# Patient Record
Sex: Female | Born: 1960 | Race: Asian | Marital: Married | Smoking: Never smoker
Health system: Southern US, Community
[De-identification: ages and names within clinical notes are randomized; demographics above are authoritative.]

---

## 2015-10-14 ENCOUNTER — Encounter: Payer: Self-pay | Admitting: Emergency Medicine

## 2015-10-14 ENCOUNTER — Observation Stay
Admission: EM | Admit: 2015-10-14 | Discharge: 2015-10-15 | Disposition: A | Discharge status: Home / Self Care | Payer: BLUE CROSS/BLUE SHIELD | Attending: Internal Medicine | Admitting: Internal Medicine

## 2015-10-14 ENCOUNTER — Emergency Department: Payer: BLUE CROSS/BLUE SHIELD

## 2015-10-14 DIAGNOSIS — R0600 Dyspnea, unspecified: Secondary | ICD-10-CM | POA: Insufficient documentation

## 2015-10-14 DIAGNOSIS — Z7982 Long term (current) use of aspirin: Secondary | ICD-10-CM | POA: Diagnosis not present

## 2015-10-14 DIAGNOSIS — I639 Cerebral infarction, unspecified: Secondary | ICD-10-CM

## 2015-10-14 DIAGNOSIS — G459 Transient cerebral ischemic attack, unspecified: Secondary | ICD-10-CM

## 2015-10-14 DIAGNOSIS — I10 Essential (primary) hypertension: Secondary | ICD-10-CM | POA: Diagnosis not present

## 2015-10-14 DIAGNOSIS — R200 Anesthesia of skin: Principal | ICD-10-CM | POA: Insufficient documentation

## 2015-10-14 DIAGNOSIS — Z8249 Family history of ischemic heart disease and other diseases of the circulatory system: Secondary | ICD-10-CM | POA: Diagnosis not present

## 2015-10-14 DIAGNOSIS — H538 Other visual disturbances: Secondary | ICD-10-CM | POA: Insufficient documentation

## 2015-10-14 LAB — BASIC METABOLIC PANEL
ANION GAP: 5 (ref 5–15)
BUN: 23 mg/dL — ABNORMAL HIGH (ref 6–20)
CHLORIDE: 108 mmol/L (ref 101–111)
CO2: 27 mmol/L (ref 22–32)
Calcium: 9.9 mg/dL (ref 8.9–10.3)
Creatinine, Ser: 0.92 mg/dL (ref 0.44–1.00)
Glucose, Bld: 89 mg/dL (ref 65–99)
POTASSIUM: 4 mmol/L (ref 3.5–5.1)
SODIUM: 140 mmol/L (ref 135–145)

## 2015-10-14 LAB — URINALYSIS COMPLETE WITH MICROSCOPIC (ARMC ONLY)
BACTERIA UA: NONE SEEN
Bilirubin Urine: NEGATIVE
GLUCOSE, UA: NEGATIVE mg/dL
HGB URINE DIPSTICK: NEGATIVE
Ketones, ur: NEGATIVE mg/dL
LEUKOCYTES UA: NEGATIVE
NITRITE: NEGATIVE
PH: 6 (ref 5.0–8.0)
Protein, ur: NEGATIVE mg/dL
SPECIFIC GRAVITY, URINE: 1.015 (ref 1.005–1.030)

## 2015-10-14 LAB — TROPONIN I: Troponin I: <0.03 ng/mL (ref <0.03)

## 2015-10-14 LAB — CBC
HEMATOCRIT: 43.3 % (ref 35.0–47.0)
HEMOGLOBIN: 14.1 g/dL (ref 12.0–16.0)
MCH: 25.8 pg — ABNORMAL LOW (ref 26.0–34.0)
MCHC: 32.6 g/dL (ref 32.0–36.0)
MCV: 79 fL — ABNORMAL LOW (ref 80.0–100.0)
Platelets: 210 10*3/uL (ref 150–440)
RBC: 5.48 MIL/uL — AB (ref 3.80–5.20)
RDW: 14.5 % (ref 11.5–14.5)
WBC: 5.4 10*3/uL (ref 3.6–11.0)

## 2015-10-14 LAB — TSH: TSH: 2.023 u[IU]/mL (ref 0.350–4.500)

## 2015-10-14 LAB — GLUCOSE, CAPILLARY: GLUCOSE-CAPILLARY: 81 mg/dL (ref 65–99)

## 2015-10-14 MED ORDER — ONDANSETRON HCL 4 MG PO TABS: 4.0000 mg | ORAL_TABLET | Freq: Four times a day (QID) | ORAL | Status: DC | PRN

## 2015-10-14 MED ORDER — ONDANSETRON HCL 4 MG/2ML IJ SOLN: 4.0000 mg | Freq: Four times a day (QID) | INTRAMUSCULAR | Status: DC | PRN

## 2015-10-14 MED ORDER — ASPIRIN 325 MG PO TABS
325.0000 mg | ORAL_TABLET | Freq: Once | ORAL | Status: AC
  Administered 2015-10-14: 325 mg via ORAL
  Filled 2015-10-14: qty 1

## 2015-10-14 MED ORDER — ENOXAPARIN SODIUM 40 MG/0.4ML ~~LOC~~ SOLN
40.0000 mg | SUBCUTANEOUS | Status: DC
  Filled 2015-10-14: qty 0.4

## 2015-10-14 MED ORDER — ACETAMINOPHEN 325 MG PO TABS: 650.0000 mg | ORAL_TABLET | Freq: Four times a day (QID) | ORAL | Status: DC | PRN

## 2015-10-14 MED ORDER — LISINOPRIL 10 MG PO TABS
10.0000 mg | ORAL_TABLET | Freq: Every day | ORAL | Status: DC
  Administered 2015-10-14 – 2015-10-15 (×2): 10 mg via ORAL
  Filled 2015-10-14 (×2): qty 1

## 2015-10-14 MED ORDER — ALPRAZOLAM 0.25 MG PO TABS
0.2500 mg | ORAL_TABLET | Freq: Four times a day (QID) | ORAL | Status: DC | PRN
  Filled 2015-10-14: qty 1

## 2015-10-14 MED ORDER — ACETAMINOPHEN 650 MG RE SUPP: 650.0000 mg | Freq: Four times a day (QID) | RECTAL | Status: DC | PRN

## 2015-10-14 MED ORDER — SODIUM CHLORIDE 0.9% FLUSH
3.0000 mL | Freq: Two times a day (BID) | INTRAVENOUS | Status: DC
Start: 2015-10-14 — End: 2015-10-15
  Administered 2015-10-14 – 2015-10-15 (×2): 3 mL via INTRAVENOUS

## 2015-10-14 MED ORDER — ATORVASTATIN CALCIUM 20 MG PO TABS
40.0000 mg | ORAL_TABLET | Freq: Every day | ORAL | Status: DC
  Administered 2015-10-14: 19:00:00 40 mg via ORAL
  Filled 2015-10-14: qty 2

## 2015-10-14 MED ORDER — HYDRALAZINE HCL 20 MG/ML IJ SOLN
10.0000 mg | INTRAMUSCULAR | Status: DC | PRN
  Administered 2015-10-14: 19:00:00 10 mg via INTRAVENOUS
  Filled 2015-10-14: qty 1

## 2015-10-14 MED ORDER — ASPIRIN EC 325 MG PO TBEC
325.0000 mg | DELAYED_RELEASE_TABLET | Freq: Every day | ORAL | Status: DC
Start: 2015-10-14 — End: 2015-10-15
  Administered 2015-10-15: 11:00:00 325 mg via ORAL
  Filled 2015-10-14: qty 1

## 2015-10-14 MED ORDER — ASPIRIN 81 MG PO CHEW
CHEWABLE_TABLET | ORAL | Status: AC
  Filled 2015-10-14: qty 4

## 2015-10-14 NOTE — ED Notes (Signed)
Pt in nad.  Sitting on bed in hallway.

## 2015-10-14 NOTE — ED Notes (Signed)
Pt presents to ED c/o numbness to extremities with dizziness that is intermittent since last week. Pt states these events are short; often checks BP but has not been diagnosed with high blood pressure. A/o x4, in NAD

## 2015-10-14 NOTE — H&P (Signed)
[facility] [place] [facility]   PATIENT NAME: Regina Hoffman    MR#:  [id number]  DATE OF BIRTH:  September 28, 1960  DATE OF ADMISSION:  10/14/2015  PRIMARY CARE PHYSICIAN: No PCP Per Patient   REQUESTING/REFERRING PHYSICIAN:   CHIEF COMPLAINT:   Chief Complaint  Patient presents with  . Numbness  . Dizziness  . Hypertension    HISTORY OF PRESENT ILLNESS: Regina Hoffman  is a 55 y.o. female with not significant past medical history, who does not have primary care physician and had not been seen one for a while, who presents to the hospitals complains of one-week history of left-sided numbness and weakness, coming on and off 3 or 4 times a day. Patient denies any falls, however, admits of significant weakness with episodes of numbness of the left side of her face and body. On arrival to emergency room, her EKG revealed a sinus rhythm with premature atrial complexes, CT of head was unremarkable. She denies any numbness or weakness at present.  PAST MEDICAL HISTORY:  History reviewed. No pertinent past medical history.  PAST SURGICAL HISTORY: History reviewed. No pertinent past surgical history.  SOCIAL HISTORY:  Social History  Substance Use Topics  . Smoking status: Never Smoker   . Smokeless tobacco: Not on file  . Alcohol Use: No    FAMILY HISTORY: Significant for hypertension, patient's mother had MI at age of 54, father had MI at age of 977.  DRUG ALLERGIES: No Known Allergies  Review of Systems  Constitutional: Positive for chills. Negative for fever, weight loss and malaise/fatigue.  HENT: Positive for congestion.   Eyes: Positive for blurred vision. Negative for double vision.  Respiratory: Positive for shortness of breath. Negative for cough, sputum production and wheezing.   Cardiovascular: Negative for chest pain, palpitations, orthopnea, leg swelling and PND.  Gastrointestinal: Negative for nausea, vomiting, abdominal pain, diarrhea, constipation, blood in  stool and melena.  Genitourinary: Negative for dysuria, urgency, frequency and hematuria.  Musculoskeletal: Negative for falls.  Skin: Negative for rash.  Neurological: Positive for sensory change, focal weakness and weakness. Negative for dizziness.  Psychiatric/Behavioral: Negative for depression and memory loss. The patient is not nervous/anxious.     MEDICATIONS AT HOME:  Prior to Admission medications   Medication Sig Start Date End Date Taking? Authorizing Provider  aspirin EC 81 MG tablet Take 81 mg by mouth daily.   Yes Historical Provider, MD      PHYSICAL EXAMINATION:   VITAL SIGNS: Blood pressure 178/72, pulse 54, temperature 98 F (36.7 C), temperature source Oral, resp. rate 14, height 5\' 4"  (1.626 m), weight 70.308 kg (155 lb), SpO2 98 %.  GENERAL:  55 y.o.-year-old patient lying in the bed with no acute distress.  EYES: Pupils equal, round, reactive to light and accommodation. No scleral icterus. Extraocular muscles intact.  HEENT: Head atraumatic, normocephalic. Oropharynx and nasopharynx clear.  NECK:  Supple, no jugular venous distention. No thyroid enlargement, no tenderness.  LUNGS: Normal breath sounds bilaterally, no wheezing, rales,rhonchi or crepitation. No use of accessory muscles of respiration.  CARDIOVASCULAR: S1, S2 normal. No murmurs, rubs, or gallops.  ABDOMEN: Soft, nontender, nondistended. Bowel sounds present. No organomegaly or mass.  EXTREMITIES: No pedal edema, cyanosis, or clubbing.  NEUROLOGIC: Cranial nerves II through XII are intact. Muscle strength 5/5 in all extremities. Sensation intact. Gait not checked.  PSYCHIATRIC: The patient is alert and oriented x 3.  SKIN: No obvious rash, lesion, or ulcer. Bruising of the  chin was noted  LABORATORY PANEL:   CBC  Recent Labs Lab 10/14/15 1130  WBC 5.4  HGB 14.1  HCT 43.3  PLT 210  MCV 79.0*  MCH 25.8*  MCHC 32.6  RDW 14.5    ------------------------------------------------------------------------------------------------------------------  Chemistries   Recent Labs Lab 10/14/15 1130  NA 140  K 4.0  CL 108  CO2 27  GLUCOSE 89  BUN 23*  CREATININE 0.92  CALCIUM 9.9   ------------------------------------------------------------------------------------------------------------------  Cardiac Enzymes No results for input(s): TROPONINI in the last 168 hours. ------------------------------------------------------------------------------------------------------------------  RADIOLOGY: Ct Head Wo Contrast  10/14/2015  CLINICAL DATA:  Numbness to extremities with dizziness that is intermittent since last week. EXAM: CT HEAD WITHOUT CONTRAST TECHNIQUE: Contiguous axial images were obtained from the base of the skull through the vertex without intravenous contrast. COMPARISON:  None. FINDINGS: Brain: Ventricles are normal in size and configuration. All areas of the brain demonstrate normal gray-white matter attenuation. There is no mass, hemorrhage, edema or other evidence of acute parenchymal abnormality. No extra-axial hemorrhage. Vascular: No hyperdense vessel or unexpected calcification. Skull: Negative for fracture or focal lesion. Sinuses/Orbits: No acute findings. Visualized upper paranasal sinuses are clear. Mastoid air cells are clear. Other: None. IMPRESSION: Normal head CT. Electronically Signed   By: Bary Richard M.D.   On: 10/14/2015 12:34    EKG: Orders placed or performed during the hospital encounter of 10/14/15  . EKG 12-Lead  . EKG 12-Lead  . ED EKG  . ED EKG   EKG in the emergency room revealed sinus rhythm with premature atrial complexes, rate of 60 bpm, normal axis, no acute distress. T changes noted   IMPRESSION AND PLAN:  Principal Problem:   TIA (transient ischemic attack) Active Problems:   Malignant essential hypertension #1 frequent TIAs, likely related to malignant essential  hypertension, admitted to a medical floor, initiate her on aspirin full dose, get carotid ultrasound as well as echocardiogram, neurology consultation #2. Malignant essential hypertension, initiate patient on lisinopril, advance it as needed #3. Dyspnea, check O2 sats on exertion, could be related to markedly elevated blood pressure #4, blurry vision, very likely hypertensive encephalopathy, follow with therapy   All the records are reviewed and case discussed with ED provider. Management plans discussed with the patient, family and they are in agreement.  CODE STATUS: Code Status History    This patient does not have a recorded code status. Please follow your organizational policy for patients in this situation.       TOTAL TIME TAKING CARE OF THIS PATIENT: 50 minutes.    Katharina Caper M.D on 10/14/2015 at 4:47 PM  Between 7am to 6pm - Pager - [phone] After 6pm go to [email or web address] - password [email or web address] [facility]  [facility]  Office  [phone]  CC: Primary care physician; No PCP Per Patient

## 2015-10-14 NOTE — ED Notes (Signed)
Pt sitting on the side of the bed  - she reports  "I have been feeling dizzy for about one week now - I went to church today and then here - I do not know why I have been dizzy."  Assessment completed  She denies pain   CBG 81

## 2015-10-14 NOTE — ED Provider Notes (Signed)
[facility] Emergency Department Provider Note  ____________________________________________  Time seen: Approximately 11:56 AM  I have reviewed the triage vital signs and the nursing notes.   HISTORY  Chief Complaint Numbness; Dizziness; and Hypertension   HPI Regina Hoffman is a 55 y.o. female no significant past medical history who presents for evaluation of left-sided numbness. Patient reports that she has noticed her blood pressure is elevated up to 200 systolic at home for the last 10 years. She hasn't seen a doctor and she does not have health insurance for more than 20 years. She reports that since last week she's been having episodes of left-sided numbness. Her last episode was just prior to arrival to the emergency department. These episodes resolved completely with no intervention. She reports numbness in the left side of her face, her left upper and lower extremities that lasted minutes up to hours sometimes. She denies headache, weakness, facial droop, slurred speech, gait instability. She reports a history of stroke in both her father and her mother. She does not smoke. She denies history of diabetes. She denies chest pain, shortness of breath, headache, changes in vision, nausea, vomiting, abdominal pain, back pain. Patient is accompanied by her daughter. She denies numbness at this time however does endorse dizziness.  History reviewed. No pertinent past medical history.  Patient Active Problem List   Diagnosis Date Noted  . Malignant essential hypertension 10/14/2015  . TIA (transient ischemic attack) 10/14/2015    History reviewed. No pertinent past surgical history.  No current outpatient prescriptions on file.  Allergies Review of patient's allergies indicates no known allergies.  History reviewed. No pertinent family history.  Social History Social History  Substance Use Topics  . Smoking status: Never Smoker   . Smokeless tobacco: None    . Alcohol Use: No    Review of Systems  Constitutional: Negative for fever. Eyes: Negative for visual changes. ENT: Negative for sore throat. Cardiovascular: Negative for chest pain. Respiratory: Negative for shortness of breath. Gastrointestinal: Negative for abdominal pain, vomiting or diarrhea. Genitourinary: Negative for dysuria. Musculoskeletal: Negative for back pain. Skin: Negative for rash. Neurological: Negative for headaches. +Left-sided numbness  ____________________________________________   PHYSICAL EXAM:  VITAL SIGNS: ED Triage Vitals  Enc Vitals Group     BP 10/14/15 1113 166/83 mmHg     Pulse Rate 10/14/15 1113 18     Resp 10/14/15 1113 18     Temp 10/14/15 1113 98 F (36.7 C)     Temp Source 10/14/15 1113 Oral     SpO2 10/14/15 1113 100 %     Weight 10/14/15 1113 155 lb (70.308 kg)     Height 10/14/15 1113  (1.626 m)     Head Cir --      Peak Flow --      Pain Score --      Pain Loc --      Pain Edu? --      Excl. in GC? --     Constitutional: Alert and oriented. Well appearing and in no apparent distress. HEENT:      Head: Normocephalic and atraumatic.         Eyes: Conjunctivae are normal. Sclera is non-icteric. EOMI. PERRL      Mouth/Throat: Mucous membranes are moist.       Neck: Supple with no signs of meningismus. Cardiovascular: Regular rate and rhythm. No murmurs, gallops, or rubs. 2+ symmetrical distal pulses are present in all extremities. No JVD.  Respiratory: Normal respiratory effort. Lungs are clear to auscultation bilaterally. No wheezes, crackles, or rhonchi.  Gastrointestinal: Soft, non tender, and non distended with positive bowel sounds. No rebound or guarding. Genitourinary: No suprapubic tenderness. No CVA tenderness. Musculoskeletal: Nontender with normal range of motion in all extremities. No edema, cyanosis, or erythema of extremities. Neurologic: A & O x3, PERRL, no nystagmus, CN II-XII intact, motor testing reveals  good tone and bulk throughout. There is no evidence of pronator drift or dysmetria. Muscle strength is 5/5 throughout. Deep tendon reflexes are 2+ throughout with downgoing toes. Sensory examination is intact. Gait is normal. Skin: Skin is warm, dry and intact. No rash noted. Psychiatric: Mood and affect are normal. Speech and behavior are normal.  ____________________________________________   LABS (all labs ordered are listed, but only abnormal results are displayed)  Labs Reviewed  BASIC METABOLIC PANEL - Abnormal; Notable for the following:    BUN 23 (*)    All other components within normal limits  CBC - Abnormal; Notable for the following:    RBC 5.48 (*)    MCV 79.0 (*)    MCH 25.8 (*)    All other components within normal limits  URINALYSIS COMPLETEWITH MICROSCOPIC ([facility] ONLY) - Abnormal; Notable for the following:    Color, Urine YELLOW (*)    APPearance CLEAR (*)    Squamous Epithelial / LPF 0-5 (*)    All other components within normal limits  LIPID PANEL - Abnormal; Notable for the following:    Cholesterol 204 (*)    LDL Cholesterol 132 (*)    All other components within normal limits  BASIC METABOLIC PANEL - Abnormal; Notable for the following:    Glucose, Bld 104 (*)    All other components within normal limits  CBC - Abnormal; Notable for the following:    MCV 78.5 (*)    All other components within normal limits  GLUCOSE, CAPILLARY  TROPONIN I  TSH  TROPONIN I  TROPONIN I  HEMOGLOBIN A1C  CBG MONITORING, ED   ____________________________________________  EKG  ED ECG REPORT I, Nita Sicklearolina Rillie Riffel, the attending physician, personally viewed and interpreted this ECG.  Sinus rhythm with occasional PVCs, rate of 60, normal intervals, normal axis, no ST elevations or depressions. ____________________________________________  RADIOLOGY  Head CT: negative ____________________________________________   PROCEDURES  Procedure(s) performed:  None Critical Care performed:  None ____________________________________________   INITIAL IMPRESSION / ASSESSMENT AND PLAN / ED COURSE  55 y.o. female no significant past medical history who presents for evaluation of multiple episodes of left-sided numbness over the course of the last week with last episode this morning. Patient has strong family history of stroke. Patient has undiagnosed hypertension and hasn't seen a doctor in over 20 years. She is neurologically intact at this time. Plan for labs and a head CT. We'll discuss with the hospitalist for admission for TIA workup.  ----------------------------------------- 3:18 PM on 10/14/2015 -----------------------------------------  CT negative. Patient remains neurologically intact. Full dose aspirin given. Patient be admitted to the hospitalist for TIA workup.   Pertinent labs & imaging results that were available during my care of the patient were reviewed by me and considered in my medical decision making (see chart for details).    ____________________________________________   FINAL CLINICAL IMPRESSION(S) / ED DIAGNOSES  Final diagnoses:  Transient cerebral ischemia, unspecified transient cerebral ischemia type      NEW MEDICATIONS STARTED DURING THIS VISIT:  Current Discharge Medication List  Note:  This document was prepared using Dragon voice recognition software and may include unintentional dictation errors.    Nita Sickle, MD 10/15/15 [phone]

## 2015-10-15 ENCOUNTER — Observation Stay: Payer: BLUE CROSS/BLUE SHIELD

## 2015-10-15 ENCOUNTER — Observation Stay (HOSPITAL_BASED_OUTPATIENT_CLINIC_OR_DEPARTMENT_OTHER)
Admit: 2015-10-15 | Discharge: 2015-10-15 | Disposition: A | Discharge status: Home / Self Care | Payer: BLUE CROSS/BLUE SHIELD | Attending: Internal Medicine | Admitting: Internal Medicine

## 2015-10-15 DIAGNOSIS — R200 Anesthesia of skin: Secondary | ICD-10-CM | POA: Diagnosis not present

## 2015-10-15 DIAGNOSIS — G459 Transient cerebral ischemic attack, unspecified: Secondary | ICD-10-CM | POA: Diagnosis not present

## 2015-10-15 LAB — BASIC METABOLIC PANEL
Anion gap: 7 (ref 5–15)
BUN: 18 mg/dL (ref 6–20)
CALCIUM: 9.8 mg/dL (ref 8.9–10.3)
CO2: 26 mmol/L (ref 22–32)
CREATININE: 0.9 mg/dL (ref 0.44–1.00)
Chloride: 106 mmol/L (ref 101–111)
Glucose, Bld: 104 mg/dL — ABNORMAL HIGH (ref 65–99)
Potassium: 3.6 mmol/L (ref 3.5–5.1)
SODIUM: 139 mmol/L (ref 135–145)

## 2015-10-15 LAB — LIPID PANEL
Cholesterol: 204 mg/dL — ABNORMAL HIGH (ref 0–200)
HDL: 46 mg/dL (ref >40)
LDL CALC: 132 mg/dL — AB (ref 0–99)
TRIGLYCERIDES: 130 mg/dL (ref <150)
Total CHOL/HDL Ratio: 4.4 RATIO
VLDL: 26 mg/dL (ref 0–40)

## 2015-10-15 LAB — CBC
HCT: 40.1 % (ref 35.0–47.0)
Hemoglobin: 13.3 g/dL (ref 12.0–16.0)
MCH: 26 pg (ref 26.0–34.0)
MCHC: 33.1 g/dL (ref 32.0–36.0)
MCV: 78.5 fL — ABNORMAL LOW (ref 80.0–100.0)
Platelets: 195 10*3/uL (ref 150–440)
RBC: 5.11 MIL/uL (ref 3.80–5.20)
RDW: 14 % (ref 11.5–14.5)
WBC: 7.1 10*3/uL (ref 3.6–11.0)

## 2015-10-15 LAB — TROPONIN I

## 2015-10-15 LAB — ECHOCARDIOGRAM COMPLETE
HEIGHTINCHES: 64 in
Weight: 2480 oz

## 2015-10-15 LAB — HEMOGLOBIN A1C: Hgb A1c MFr Bld: 5.4 % (ref 4.0–6.0)

## 2015-10-15 MED ORDER — LISINOPRIL 10 MG PO TABS: 10.0000 mg | ORAL_TABLET | Freq: Every day | ORAL | Status: AC

## 2015-10-15 NOTE — Progress Notes (Signed)
*  PRELIMINARY RESULTS* Echocardiogram 2D Echocardiogram has been performed.  Cristela BlueHege, [name] 10/15/2015, 10:34 AM

## 2015-10-15 NOTE — Progress Notes (Signed)
SATURATION QUALIFICATIONS: (This note is used to comply with regulatory documentation for home oxygen)  Patient Saturations on Room Air at Rest = 100%  Patient Saturations on Room Air while Ambulating = 98% 

## 2015-10-15 NOTE — Discharge Summary (Signed)
[facility] - [place] at [facility]   PATIENT NAME: Regina Hoffman Cordova    MR#:  [id number]  DATE OF BIRTH:  1960/08/20  DATE OF ADMISSION:  10/14/2015 ADMITTING PHYSICIAN: Katharina Caperima Vaickute, MD  DATE OF DISCHARGE: 10/15/2015  PRIMARY CARE PHYSICIAN: No PCP Per Patient    ADMISSION DIAGNOSIS:  CVA (cerebral infarction) [I63.9] Transient cerebral ischemia, unspecified transient cerebral ischemia type [G45.9]  DISCHARGE DIAGNOSIS:  Numbness left side Essential hypertension  SECONDARY DIAGNOSIS:  History reviewed. No pertinent past medical history.  HOSPITAL COURSE:   1. Numbness left side of the body. This lasts minutes at a time. MRI of the brain negative. EEG negative. Continue aspirin at home. I doubt this is a TIA. This is not a stroke. Follow-up with neurology as outpatient and consider an EMG. 2. Essential hypertension. Lisinopril started in the hospital. Blood pressure trending better. Low-salt diet discussed. Compliance with blood pressure medications and follow-up with medical doctor needed.  DISCHARGE CONDITIONS:   Satisfactory  CONSULTS OBTAINED:  Treatment Team:  Thana FarrLeslie Reynolds, MD  DRUG ALLERGIES:  No Known Allergies  DISCHARGE MEDICATIONS:   Current Discharge Medication List    START taking these medications   Details  lisinopril (PRINIVIL,ZESTRIL) 10 MG tablet Take 1 tablet (10 mg total) by mouth daily. Qty: 30 tablet, Refills: 0      CONTINUE these medications which have NOT CHANGED   Details  aspirin EC 81 MG tablet Take 81 mg by mouth daily.         DISCHARGE INSTRUCTIONS:   Patient given names of primary care physicians in the area Follow-up with neurology 2 weeks  If you experience worsening of your admission symptoms, develop shortness of breath, life threatening emergency, suicidal or homicidal thoughts you must seek medical attention immediately by calling 911 or calling your MD immediately  if symptoms less severe.  You Must read  complete instructions/literature along with all the possible adverse reactions/side effects for all the Medicines you take and that have been prescribed to you. Take any new Medicines after you have completely understood and accept all the possible adverse reactions/side effects.   Please note  You were cared for by a hospitalist during your hospital stay. If you have any questions about your discharge medications or the care you received while you were in the hospital after you are discharged, you can call the unit and asked to speak with the hospitalist on call if the hospitalist that took care of you is not available. Once you are discharged, your primary care physician will handle any further medical issues. Please note that NO REFILLS for any discharge medications will be authorized once you are discharged, as it is imperative that you return to your primary care physician (or establish a relationship with a primary care physician if you do not have one) for your aftercare needs so that they can reassess your need for medications and monitor your lab values.    Today   CHIEF COMPLAINT:   Chief Complaint  Patient presents with  . Numbness  . Dizziness  . Hypertension    HISTORY OF PRESENT ILLNESS:  Regina Hoffman Yusko  is a 55 y.o. female presents with numbness on the left side   VITAL SIGNS:  Blood pressure 144/61, pulse 63, temperature 98.2 F (36.8 C), temperature source Oral, resp. rate 18, height 5\' 4"  (1.626 m), weight 70.308 kg (155 lb), SpO2 100 %.    PHYSICAL EXAMINATION:  GENERAL:  55 y.o.-year-old patient lying in the  bed with no acute distress.  EYES: Pupils equal, round, reactive to light and accommodation. No scleral icterus. Extraocular muscles intact.  HEENT: Head atraumatic, normocephalic. Oropharynx and nasopharynx clear.  NECK:  Supple, no jugular venous distention. No thyroid enlargement, no tenderness.  LUNGS: Normal breath sounds bilaterally, no wheezing, rales,rhonchi  or crepitation. No use of accessory muscles of respiration.  CARDIOVASCULAR: S1, S2 normal. No murmurs, rubs, or gallops.  ABDOMEN: Soft, non-tender, non-distended. Bowel sounds present. No organomegaly or mass.  EXTREMITIES: No pedal edema, cyanosis, or clubbing.  NEUROLOGIC: Cranial nerves II through XII are intact. Muscle strength 5/5 in all extremities. Sensation intact. Gait not checked.  PSYCHIATRIC: The patient is alert and oriented x 3.  SKIN: No obvious rash, lesion, or ulcer.   DATA REVIEW:   CBC  Recent Labs Lab 10/15/15  WBC 7.1  HGB 13.3  HCT 40.1  PLT 195    Chemistries   Recent Labs Lab 10/15/15  NA 139  K 3.6  CL 106  CO2 26  GLUCOSE 104*  BUN 18  CREATININE 0.90  CALCIUM 9.8    Cardiac Enzymes  Recent Labs Lab 10/15/15 0542  TROPONINI <0.03      RADIOLOGY:  Ct Head Wo Contrast  10/14/2015  CLINICAL DATA:  Numbness to extremities with dizziness that is intermittent since last week. EXAM: CT HEAD WITHOUT CONTRAST TECHNIQUE: Contiguous axial images were obtained from the base of the skull through the vertex without intravenous contrast. COMPARISON:  None. FINDINGS: Brain: Ventricles are normal in size and configuration. All areas of the brain demonstrate normal gray-white matter attenuation. There is no mass, hemorrhage, edema or other evidence of acute parenchymal abnormality. No extra-axial hemorrhage. Vascular: No hyperdense vessel or unexpected calcification. Skull: Negative for fracture or focal lesion. Sinuses/Orbits: No acute findings. Visualized upper paranasal sinuses are clear. Mastoid air cells are clear. Other: None. IMPRESSION: Normal head CT. Electronically Signed   By: Bary Khrystal Jeanmarie M.D.   On: 10/14/2015 12:34   Mr Brain Wo Contrast  10/15/2015  CLINICAL DATA:  55 -year-old female with hypertension presenting with 1 week of left-sided numbness and weakness 3 to 4 times a day. EXAM: MRI HEAD WITHOUT CONTRAST TECHNIQUE: Multiplanar,  multiecho pulse sequences of the brain and surrounding structures were obtained without intravenous contrast. COMPARISON:  CT of the head dated 10/14/2015. FINDINGS: No evidence of acute infarct, intracranial hemorrhage, or focal mass effect. Few nonspecific foci of T2 FLAIR hyperintensity in subcortical white matter in a predominant bifrontal distribution probably represent minimal chronic microvascular ischemic changes. Normal ventricle size. No hydrocephalus. No extra-axial collection is identified. Small bilateral maxillary sinus mucous retention cysts. Mild mucosal thickening of ethmoid sinuses. Underpneumatized frontal sinuses. Visualized orbits are unremarkable. No abnormal signal of mastoid air cells. The calvarium is unremarkable. IMPRESSION: 1. No evidence of acute infarct, intracranial hemorrhage, or focal mass effect. 2. Minimal chronic microvascular ischemic changes. 3. Mild paranasal sinus disease. Electronically Signed   By: Mitzi Hansen M.D.   On: 10/15/2015 12:13   US Carotid Bilateral  10/15/2015  CLINICAL DATA:  Acute stroke symptoms, hypertension, syncope EXAM: BILATERAL CAROTID DUPLEX ULTRASOUND TECHNIQUE: Wallace Cullens scale imaging, color Doppler and duplex ultrasound were performed of bilateral carotid and vertebral arteries in the neck. COMPARISON:  None. FINDINGS: Criteria: Quantification of carotid stenosis is based on velocity parameters that correlate the residual internal carotid diameter with NASCET-based stenosis levels, using the diameter of the distal internal carotid lumen as the denominator for stenosis measurement. The following velocity  measurements were obtained: RIGHT ICA:  84/31 cm/sec CCA:  90/25 cm/sec SYSTOLIC ICA/CCA RATIO:  0.9 DIASTOLIC ICA/CCA RATIO:  1.2 ECA:  110 cm/sec LEFT ICA:  75/36 cm/sec CCA:  89/30 cm/sec SYSTOLIC ICA/CCA RATIO:  0.8 DIASTOLIC ICA/CCA RATIO:  1.2 ECA:  77 cm/sec RIGHT CAROTID ARTERY: Minor echogenic shadowing plaque formation. No  hemodynamically significant right ICA stenosis, velocity elevation, or turbulent flow. Degree of narrowing less than 50%. RIGHT VERTEBRAL ARTERY:  Antegrade LEFT CAROTID ARTERY: Similar scattered minor echogenic plaque formation. No hemodynamically significant left ICA stenosis, velocity elevation, or turbulent flow. LEFT VERTEBRAL ARTERY:  Antegrade IMPRESSION: Minor carotid atherosclerosis. No hemodynamically significant ICA stenosis. Degree of narrowing less than 50% bilaterally. Patent antegrade vertebral flow bilaterally. Electronically Signed   By: Judie Petit.  Shick M.D.   On: 10/15/2015 11:17    Management plans discussed with the patient, family and they are in agreement.  CODE STATUS:     Code Status Orders        Start     Ordered   10/14/15 1819  Full code   Continuous     10/14/15 1818    Code Status History    Date Active Date Inactive Code Status Order ID Comments User Context   This patient has a current code status but no historical code status.      TOTAL TIME TAKING CARE OF THIS PATIENT: 35 minutes.    Alford Highland M.D on 10/15/2015 at 3:20 PM  Between 7am to 6pm - Pager - [phone]  After 6pm go to [email or web address] - password [email or web address]  [facility] Office  [phone]  CC: Primary care physician; No PCP Per Patient

## 2015-10-15 NOTE — Care Management (Signed)
Admitted to [facility] with the diagnosis of TIA under observation status. Lives with husband. Daughter is Jola BaptistGiang Trieu [phone]). Takes care of all basic and instrumental activities of daily living herself, drives. No primary care physician. Gave list of physicians accepting new patients to daughter.  Uses no aids for ambulation.   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 Gwenette GreetBrenda S Rola Lennon RN MSN CCM Care Management [phone]

## 2015-10-15 NOTE — Progress Notes (Signed)
Discussed discharge instructions and medications with pt and her daughter at bedside.  IV removed. No questions at this time.  Pt transported home via car by her daughters.  Orson Apeanielle Khyree Carillo, RN

## 2015-10-15 NOTE — Consult Note (Addendum)
Referring Physician: Renae GlossWieting    Chief Complaint: Left sided numbness  HPI: Regina Hoffman is an 55 y.o. female who presents to the hospital complaining of a one-week history of intermittent left-sided numbness.  It may occur multiple times a day and has been daily.  It resolves on its own and only lasts a few minutes.  She describes a line going down the middle of her body and being numb to the left of it.  She has no other associated symptoms and reports no loss of consciousness or confusion.   Date last known well: One week ago Time last known well: Unable to determine tPA Given: No: Resolution of symptoms  History reviewed. No pertinent past medical history.  History reviewed. No pertinent past surgical history.  Family history: Significant for hypertension, patient's mother had MI at age of 54, father had MI at age of 777.  Social History:  reports that she has never smoked. She does not have any smokeless tobacco history on file. She reports that she does not drink alcohol or use illicit drugs.  Allergies: No Known Allergies  Medications:  I have reviewed the patient's current medications. Prior to Admission:  Prescriptions prior to admission  Medication Sig Dispense Refill Last Dose  . aspirin EC 81 MG tablet Take 81 mg by mouth daily.   10/13/2015 at Unknown time   Scheduled: . aspirin EC  325 mg Oral Daily  . atorvastatin  40 mg Oral q1800  . enoxaparin (LOVENOX) injection  40 mg Subcutaneous Q24H  . lisinopril  10 mg Oral Daily  . sodium chloride flush  3 mL Intravenous Q12H    ROS: History obtained from the patient  General ROS: negative for - chills, fatigue, fever, night sweats, weight gain or weight loss Psychological ROS: negative for - behavioral disorder, hallucinations, memory difficulties, mood swings or suicidal ideation Ophthalmic ROS: negative for - blurry vision, double vision, eye pain or loss of vision ENT ROS: negative for - epistaxis, nasal discharge, oral  lesions, sore throat, tinnitus or vertigo Allergy and Immunology ROS: negative for - hives or itchy/watery eyes Hematological and Lymphatic ROS: negative for - bleeding problems, bruising or swollen lymph nodes Endocrine ROS: negative for - galactorrhea, hair pattern changes, polydipsia/polyuria or temperature intolerance Respiratory ROS: negative for - cough, hemoptysis, shortness of breath or wheezing Cardiovascular ROS: negative for - chest pain, dyspnea on exertion, edema or irregular heartbeat Gastrointestinal ROS: negative for - abdominal pain, diarrhea, hematemesis, nausea/vomiting or stool incontinence Genito-Urinary ROS: negative for - dysuria, hematuria, incontinence or urinary frequency/urgency Musculoskeletal ROS: negative for - joint swelling or muscular weakness Neurological ROS: as noted in HPI Dermatological ROS: negative for rash and skin lesion changes  Physical Examination: Blood pressure 130/61, pulse 58, temperature 97.8 F (36.6 C), temperature source Oral, resp. rate 20, height 5\' 4"  (1.626 m), weight 70.308 kg (155 lb), SpO2 99 %.  HEENT-  Normocephalic, no lesions, without obvious abnormality.  Normal external eye and conjunctiva.  Normal TM's bilaterally.  Normal auditory canals and external ears. Normal external nose, mucus membranes and septum.  Normal pharynx. Cardiovascular- S1, S2 normal, pulses palpable throughout   Lungs- chest clear, no wheezing, rales, normal symmetric air entry Abdomen- soft, non-tender; bowel sounds normal; no masses,  no organomegaly Extremities- no edema Lymph-no adenopathy palpable Musculoskeletal-no joint tenderness, deformity or swelling Skin-warm and dry, no hyperpigmentation, vitiligo, or suspicious lesions  Neurological Examination Mental Status: Alert, oriented, thought content appropriate.  Speech fluent without evidence of  aphasia.  Able to follow 3 step commands without difficulty. Cranial Nerves: II: Discs flat  bilaterally; Visual fields grossly normal, pupils equal, round, reactive to light and accommodation III,IV, VI: ptosis not present, extra-ocular motions intact bilaterally V,VII: smile symmetric, facial light touch sensation normal bilaterally VIII: hearing normal bilaterally IX,X: gag reflex present XI: bilateral shoulder shrug XII: midline tongue extension Motor: Right : Upper extremity   5/5    Left:     Upper extremity   5/5  Lower extremity   5/5     Lower extremity   5/5 Tone and bulk:normal tone throughout; no atrophy noted Sensory: Pinprick and light touch intact throughout, bilaterally Deep Tendon Reflexes: 2+ and symmetric throughout Plantars: Right: downgoing   Left: downgoing Cerebellar: normal finger-to-nose, normal rapid alternating movements and normal heel-to-shin test Gait: normal gait and station      Laboratory Studies:  Basic Metabolic Panel:  Recent Labs Lab 10/14/15 1130 10/15/15  NA 140 139  K 4.0 3.6  CL 108 106  CO2 27 26  GLUCOSE 89 104*  BUN 23* 18  CREATININE 0.92 0.90  CALCIUM 9.9 9.8    Liver Function Tests: No results for input(s): AST, ALT, ALKPHOS, BILITOT, PROT, ALBUMIN in the last 168 hours. No results for input(s): LIPASE, AMYLASE in the last 168 hours. No results for input(s): AMMONIA in the last 168 hours.  CBC:  Recent Labs Lab 10/14/15 1130 10/15/15  WBC 5.4 7.1  HGB 14.1 13.3  HCT 43.3 40.1  MCV 79.0* 78.5*  PLT 210 195    Cardiac Enzymes:  Recent Labs Lab 10/14/15 1833 10/14/15 2358 10/15/15 0542  TROPONINI <0.03 <0.03 <0.03    BNP: Invalid input(s): POCBNP  CBG:  Recent Labs Lab 10/14/15 1214  GLUCAP 81    Microbiology: No results found for this or any previous visit.  Coagulation Studies: No results for input(s): LABPROT, INR in the last 72 hours.  Urinalysis:  Recent Labs Lab 10/14/15 1130  COLORURINE YELLOW*  LABSPEC 1.015  PHURINE 6.0  GLUCOSEU NEGATIVE  HGBUR NEGATIVE   BILIRUBINUR NEGATIVE  KETONESUR NEGATIVE  PROTEINUR NEGATIVE  NITRITE NEGATIVE  LEUKOCYTESUR NEGATIVE    Lipid Panel:    Component Value Date/Time   CHOL 204* 10/15/2015 0000   TRIG 130 10/15/2015 0000   HDL 46 10/15/2015 0000   CHOLHDL 4.4 10/15/2015 0000   VLDL 26 10/15/2015 0000   LDLCALC 132* 10/15/2015 0000    HgbA1C:  Lab Results  Component Value Date   HGBA1C 5.4 10/14/2015    Urine Drug Screen:  No results found for: LABOPIA, COCAINSCRNUR, LABBENZ, AMPHETMU, THCU, LABBARB  Alcohol Level: No results for input(s): ETH in the last 168 hours.  Other results: EKG: sinus rhythm at 60 bpm with premature atrial complexes.  Imaging: Ct Head Wo Contrast  10/14/2015  CLINICAL DATA:  Numbness to extremities with dizziness that is intermittent since last week. EXAM: CT HEAD WITHOUT CONTRAST TECHNIQUE: Contiguous axial images were obtained from the base of the skull through the vertex without intravenous contrast. COMPARISON:  None. FINDINGS: Brain: Ventricles are normal in size and configuration. All areas of the brain demonstrate normal gray-white matter attenuation. There is no mass, hemorrhage, edema or other evidence of acute parenchymal abnormality. No extra-axial hemorrhage. Vascular: No hyperdense vessel or unexpected calcification. Skull: Negative for fracture or focal lesion. Sinuses/Orbits: No acute findings. Visualized upper paranasal sinuses are clear. Mastoid air cells are clear. Other: None. IMPRESSION: Normal head CT. Electronically Signed   By:  Bary Richard M.D.   On: 10/14/2015 12:34   Mr Brain Wo Contrast  10/15/2015  CLINICAL DATA:  70 -year-old female with hypertension presenting with 1 week of left-sided numbness and weakness 3 to 4 times a day. EXAM: MRI HEAD WITHOUT CONTRAST TECHNIQUE: Multiplanar, multiecho pulse sequences of the brain and surrounding structures were obtained without intravenous contrast. COMPARISON:  CT of the head dated 10/14/2015. FINDINGS:  No evidence of acute infarct, intracranial hemorrhage, or focal mass effect. Few nonspecific foci of T2 FLAIR hyperintensity in subcortical white matter in a predominant bifrontal distribution probably represent minimal chronic microvascular ischemic changes. Normal ventricle size. No hydrocephalus. No extra-axial collection is identified. Small bilateral maxillary sinus mucous retention cysts. Mild mucosal thickening of ethmoid sinuses. Underpneumatized frontal sinuses. Visualized orbits are unremarkable. No abnormal signal of mastoid air cells. The calvarium is unremarkable. IMPRESSION: 1. No evidence of acute infarct, intracranial hemorrhage, or focal mass effect. 2. Minimal chronic microvascular ischemic changes. 3. Mild paranasal sinus disease. Electronically Signed   By: Mitzi Hansen M.D.   On: 10/15/2015 12:13   US Carotid Bilateral  10/15/2015  CLINICAL DATA:  Acute stroke symptoms, hypertension, syncope EXAM: BILATERAL CAROTID DUPLEX ULTRASOUND TECHNIQUE: Wallace Cullens scale imaging, color Doppler and duplex ultrasound were performed of bilateral carotid and vertebral arteries in the neck. COMPARISON:  None. FINDINGS: Criteria: Quantification of carotid stenosis is based on velocity parameters that correlate the residual internal carotid diameter with NASCET-based stenosis levels, using the diameter of the distal internal carotid lumen as the denominator for stenosis measurement. The following velocity measurements were obtained: RIGHT ICA:  84/31 cm/sec CCA:  90/25 cm/sec SYSTOLIC ICA/CCA RATIO:  0.9 DIASTOLIC ICA/CCA RATIO:  1.2 ECA:  110 cm/sec LEFT ICA:  75/36 cm/sec CCA:  89/30 cm/sec SYSTOLIC ICA/CCA RATIO:  0.8 DIASTOLIC ICA/CCA RATIO:  1.2 ECA:  77 cm/sec RIGHT CAROTID ARTERY: Minor echogenic shadowing plaque formation. No hemodynamically significant right ICA stenosis, velocity elevation, or turbulent flow. Degree of narrowing less than 50%. RIGHT VERTEBRAL ARTERY:  Antegrade LEFT CAROTID  ARTERY: Similar scattered minor echogenic plaque formation. No hemodynamically significant left ICA stenosis, velocity elevation, or turbulent flow. LEFT VERTEBRAL ARTERY:  Antegrade IMPRESSION: Minor carotid atherosclerosis. No hemodynamically significant ICA stenosis. Degree of narrowing less than 50% bilaterally. Patent antegrade vertebral flow bilaterally. Electronically Signed   By: Judie Petit.  Shick M.D.   On: 10/15/2015 11:17    Assessment: 55 y.o. female with episodes of left sided numbness.  Patient now at baseline.  Last episodes were overnight.  MRI of the brain personally reviewed and shows no acute changes.  Carotid dopplers show no evidence of hemodynamically significant stenosis.  Echocardiogram shows no cardiac source of emboli with an EF of 60-65%.  A1c 5.4, LDL 132.  Patient on ASA at home.  Patient with some episodes of elevated BP.  No other risk factors noted.    Stroke Risk Factors - hypertension  Plan: 1. Prophylactic therapy-Continue ASA 2. EEG 3. If EEG unremarkable would have patient continue on ASA daily and follow up with neurology on an outpatient basis.   4. Agree with addressing BP   Thana Farr, MD Neurology [phone] 10/15/2015, 12:23 PM

## 2015-10-15 NOTE — Progress Notes (Signed)
Pt transported to US, ECHO, & MRI

## 2018-11-22 ENCOUNTER — Other Ambulatory Visit: Payer: Self-pay | Admitting: Infectious Diseases

## 2018-11-22 ENCOUNTER — Ambulatory Visit
Admission: RE | Admit: 2018-11-22 | Discharge: 2018-11-22 | Disposition: A | Discharge status: Home / Self Care | Payer: BLUE CROSS/BLUE SHIELD | Source: Ambulatory Visit | Attending: Infectious Diseases | Admitting: Infectious Diseases

## 2018-11-22 ENCOUNTER — Other Ambulatory Visit: Payer: Self-pay

## 2018-11-22 DIAGNOSIS — G459 Transient cerebral ischemic attack, unspecified: Secondary | ICD-10-CM | POA: Insufficient documentation

## 2018-11-22 DIAGNOSIS — I10 Essential (primary) hypertension: Secondary | ICD-10-CM | POA: Diagnosis present

## 2018-11-22 DIAGNOSIS — R200 Anesthesia of skin: Secondary | ICD-10-CM | POA: Insufficient documentation

## 2018-12-26 ENCOUNTER — Other Ambulatory Visit: Payer: Self-pay | Admitting: Infectious Diseases

## 2018-12-26 DIAGNOSIS — Z1231 Encounter for screening mammogram for malignant neoplasm of breast: Secondary | ICD-10-CM

## 2019-01-31 ENCOUNTER — Ambulatory Visit
Admission: RE | Admit: 2019-01-31 | Discharge: 2019-01-31 | Disposition: A | Discharge status: Home / Self Care | Payer: BLUE CROSS/BLUE SHIELD | Source: Ambulatory Visit | Attending: Infectious Diseases | Admitting: Infectious Diseases

## 2019-01-31 DIAGNOSIS — Z1231 Encounter for screening mammogram for malignant neoplasm of breast: Secondary | ICD-10-CM | POA: Insufficient documentation

## 2019-02-02 ENCOUNTER — Other Ambulatory Visit: Payer: Self-pay | Admitting: Infectious Diseases

## 2019-02-02 DIAGNOSIS — N6489 Other specified disorders of breast: Secondary | ICD-10-CM

## 2019-02-02 DIAGNOSIS — R928 Other abnormal and inconclusive findings on diagnostic imaging of breast: Secondary | ICD-10-CM

## 2019-02-14 ENCOUNTER — Ambulatory Visit
Admission: RE | Admit: 2019-02-14 | Discharge: 2019-02-14 | Disposition: A | Discharge status: Home / Self Care | Payer: BLUE CROSS/BLUE SHIELD | Source: Ambulatory Visit | Attending: Infectious Diseases | Admitting: Infectious Diseases

## 2019-02-14 DIAGNOSIS — R928 Other abnormal and inconclusive findings on diagnostic imaging of breast: Secondary | ICD-10-CM | POA: Diagnosis not present

## 2019-02-14 DIAGNOSIS — N6489 Other specified disorders of breast: Secondary | ICD-10-CM | POA: Insufficient documentation

## 2019-07-01 ENCOUNTER — Ambulatory Visit: Payer: BLUE CROSS/BLUE SHIELD | Attending: Internal Medicine

## 2019-07-01 DIAGNOSIS — Z23 Encounter for immunization: Secondary | ICD-10-CM

## 2019-07-01 NOTE — Progress Notes (Signed)
   Covid-19 Vaccination Clinic  Name:  Regina Hoffman    MRN: [id number] DOB: 03-29-1961  07/01/2019  [name] was observed post Covid-19 immunization for 15 minutes without incident. She was provided with Vaccine Information Sheet and instruction to access the V-Safe system.   [name] was instructed to call 911 with any severe reactions post vaccine: Marland Kitchen Difficulty breathing  . Swelling of face and throat  . A fast heartbeat  . A bad rash all over body  . Dizziness and weakness   Immunizations Administered    Name Date Dose VIS Date Route   Pfizer COVID-19 Vaccine 07/01/2019  8:33 AM 0.3 mL 03/17/2019 Intramuscular   Manufacturer: [employer], [employer]   Lot: [id number]   NDC: [address]-3

## 2019-07-22 ENCOUNTER — Ambulatory Visit: Payer: BLUE CROSS/BLUE SHIELD | Attending: Internal Medicine

## 2019-07-22 DIAGNOSIS — Z23 Encounter for immunization: Secondary | ICD-10-CM

## 2019-07-22 NOTE — Progress Notes (Signed)
   Covid-19 Vaccination Clinic  Name:  Regina Hoffman    MRN: [id number] DOB: 1960-04-28  07/22/2019  [name] was observed post Covid-19 immunization for 15 minutes   without incident. She was provided with Vaccine Information Sheet and instruction to access the V-Safe system.   [name] was instructed to call 911 with any severe reactions post vaccine: Marland Kitchen Difficulty breathing  . Swelling of face and throat  . A fast heartbeat  . A bad rash all over body  . Dizziness and weakness   Immunizations Administered    Name Date Dose VIS Date Route   Pfizer COVID-19 Vaccine 07/22/2019  8:24 AM 0.3 mL 03/17/2019 Intramuscular   Manufacturer: [employer], [employer]   Lot: [id number]   NDC: [address]-2

## 2020-06-06 IMAGING — MG MM DIGITAL DIAGNOSTIC UNILAT*L* W/ TOMO W/ CAD
6 series · 6 of 18 positions shown · non-contrast
Comparison: Baseline exam 01/31/2019

CLINICAL DATA: Patient returns after screening study for evaluation
of possible LEFT breast asymmetry.

EXAM:
DIGITAL DIAGNOSTIC UNILATERAL LEFT MAMMOGRAM WITH CAD AND TOMO

[L CC synth-2D]
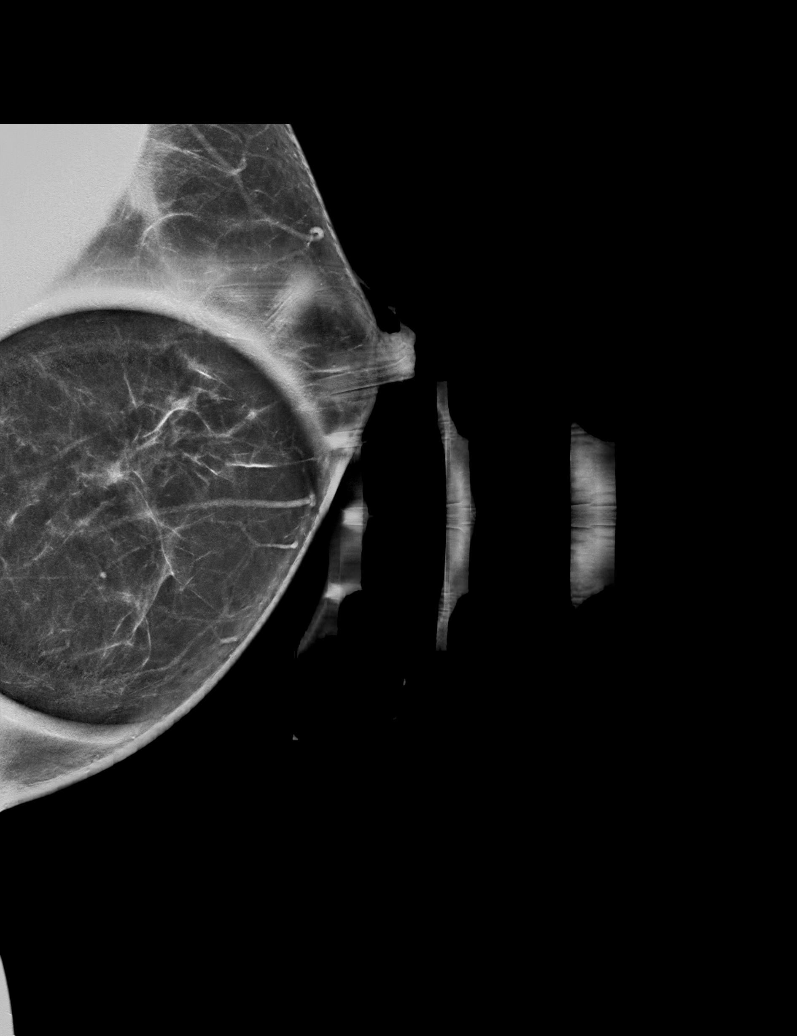

[L ML synth-2D (1 of 2)]
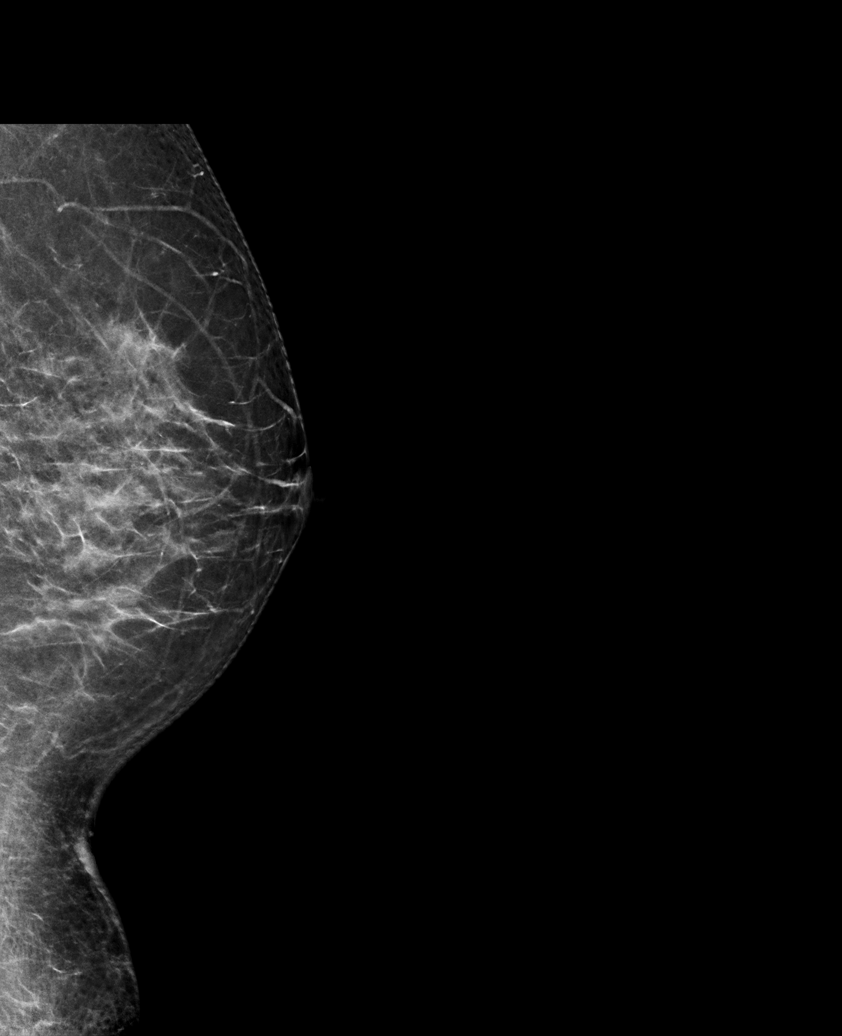

[L ML synth-2D (2 of 2)]
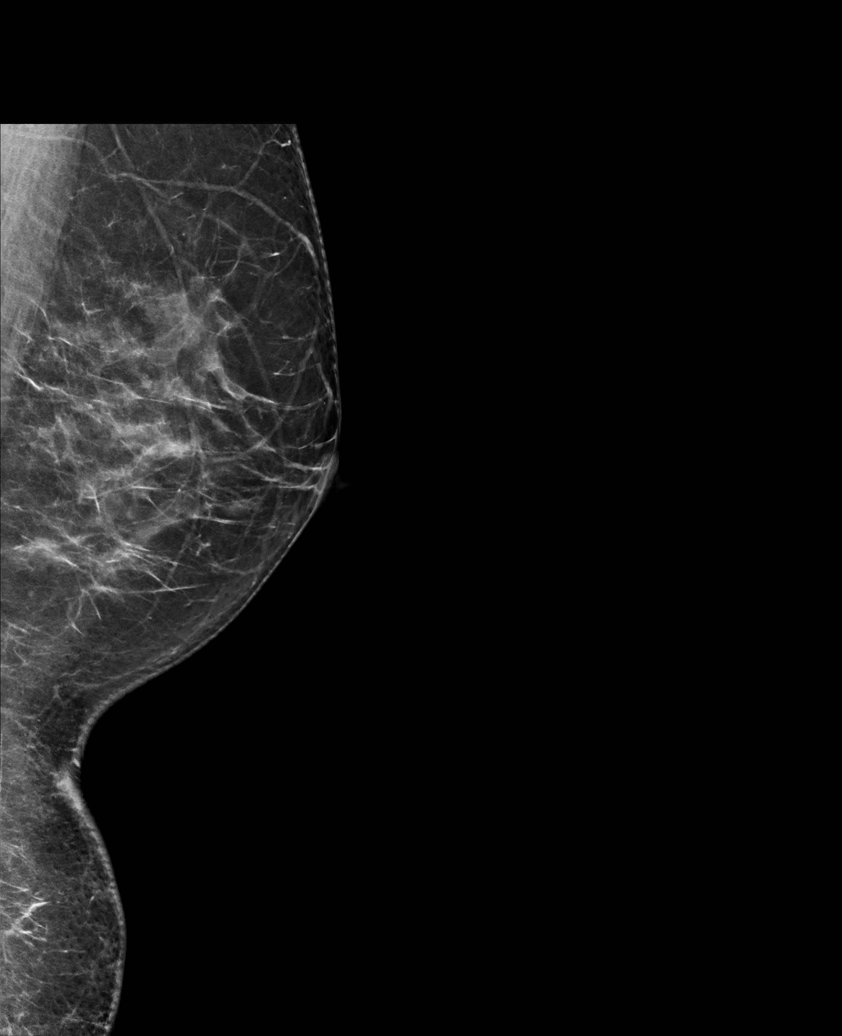

[L CC tomo · tomo slice 37/73.0]
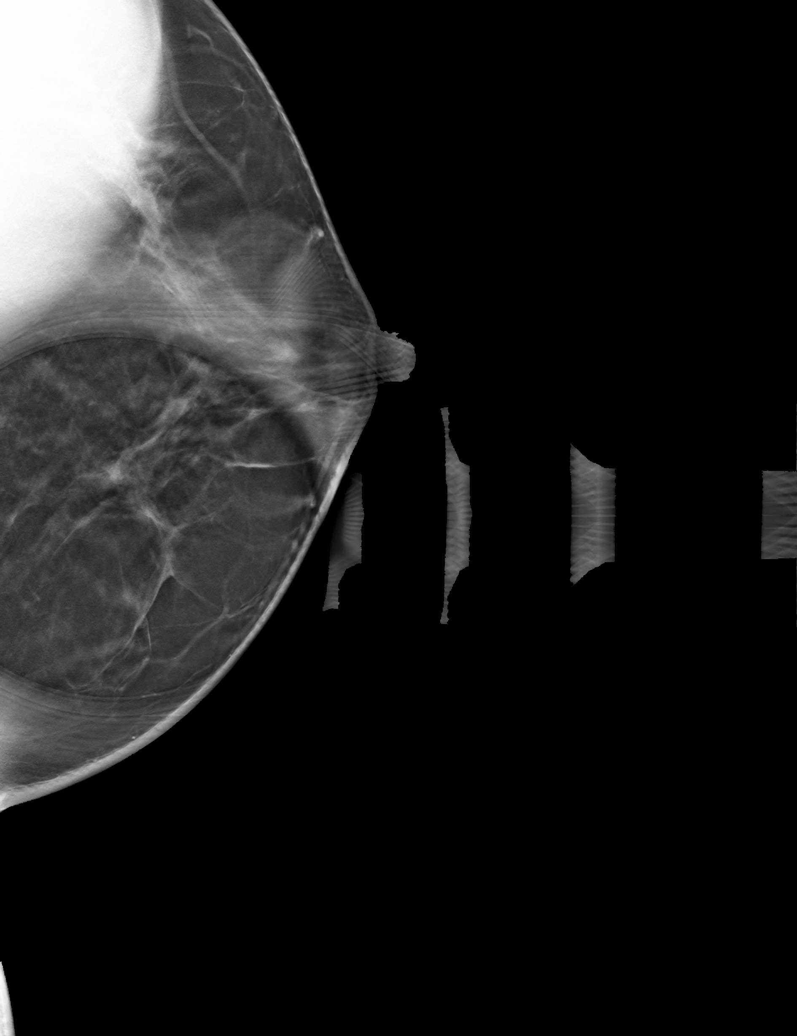

[L ML tomo (1 of 2) · tomo slice 39/76.0]
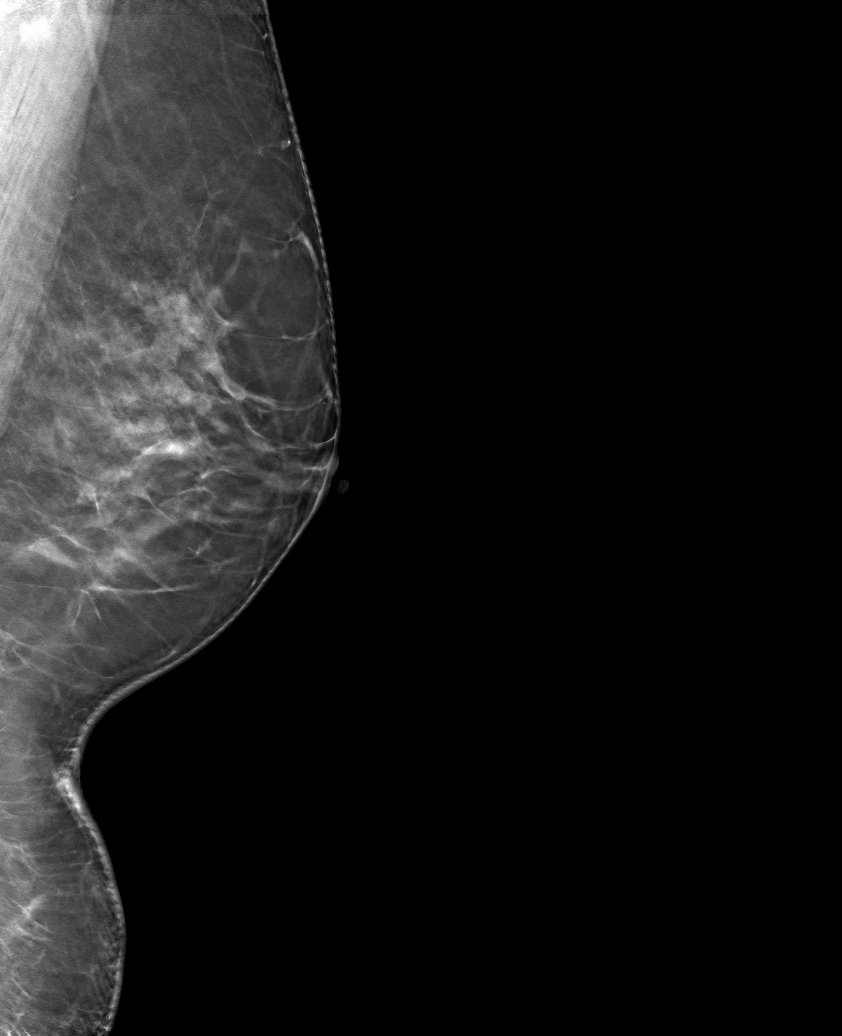

[L ML tomo (2 of 2) · tomo slice 38/75.0]
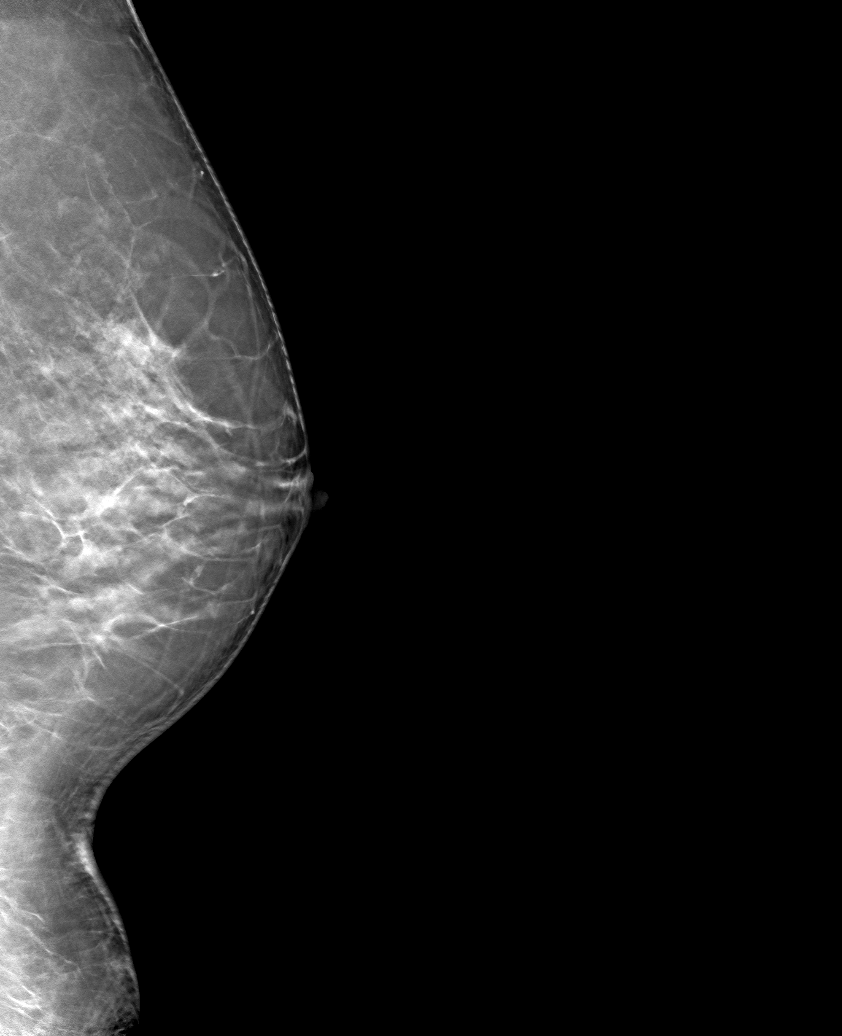

[6 of 18 positions shown; findings below may reference images not displayed]

ACR Breast Density Category b: There are scattered areas of
fibroglandular density.
FINDINGS: Additional 2-D and 3-D images are performed. These views show no
persistent abnormality in the MEDIAL portion of the LEFT breast.
Normal appearing fibroglandular tissue is identified in this region.

Mammographic images were processed with CAD.
IMPRESSION: No mammographic evidence for malignancy.

RECOMMENDATION:
Screening mammogram in one year.(Code:C9-9-SRN)

I have discussed the findings and recommendations with the patient.
If applicable, a reminder letter will be sent to the patient
regarding the next appointment.

BI-RADS CATEGORY  1: Negative.

## 2021-12-30 DIAGNOSIS — Z1211 Encounter for screening for malignant neoplasm of colon: Secondary | ICD-10-CM | POA: Diagnosis not present

## 2021-12-30 DIAGNOSIS — I10 Essential (primary) hypertension: Secondary | ICD-10-CM | POA: Diagnosis not present

## 2021-12-30 DIAGNOSIS — Z1231 Encounter for screening mammogram for malignant neoplasm of breast: Secondary | ICD-10-CM | POA: Diagnosis not present

## 2021-12-30 DIAGNOSIS — Z1212 Encounter for screening for malignant neoplasm of rectum: Secondary | ICD-10-CM | POA: Diagnosis not present

## 2021-12-30 DIAGNOSIS — G459 Transient cerebral ischemic attack, unspecified: Secondary | ICD-10-CM | POA: Diagnosis not present

## 2021-12-30 DIAGNOSIS — Z0000 Encounter for general adult medical examination without abnormal findings: Secondary | ICD-10-CM | POA: Diagnosis not present

## 2022-07-31 DIAGNOSIS — B029 Zoster without complications: Secondary | ICD-10-CM | POA: Diagnosis not present

## 2022-08-13 DIAGNOSIS — B029 Zoster without complications: Secondary | ICD-10-CM | POA: Diagnosis not present

## 2022-11-03 DIAGNOSIS — M19041 Primary osteoarthritis, right hand: Secondary | ICD-10-CM | POA: Diagnosis not present

## 2022-11-03 DIAGNOSIS — M79641 Pain in right hand: Secondary | ICD-10-CM | POA: Diagnosis not present

## 2023-01-04 ENCOUNTER — Other Ambulatory Visit: Payer: Self-pay | Admitting: Infectious Diseases

## 2023-01-04 DIAGNOSIS — G459 Transient cerebral ischemic attack, unspecified: Secondary | ICD-10-CM | POA: Diagnosis not present

## 2023-01-04 DIAGNOSIS — Z1212 Encounter for screening for malignant neoplasm of rectum: Secondary | ICD-10-CM | POA: Diagnosis not present

## 2023-01-04 DIAGNOSIS — Z0000 Encounter for general adult medical examination without abnormal findings: Secondary | ICD-10-CM | POA: Diagnosis not present

## 2023-01-04 DIAGNOSIS — Z1211 Encounter for screening for malignant neoplasm of colon: Secondary | ICD-10-CM | POA: Diagnosis not present

## 2023-01-04 DIAGNOSIS — Z1231 Encounter for screening mammogram for malignant neoplasm of breast: Secondary | ICD-10-CM

## 2023-01-04 DIAGNOSIS — I10 Essential (primary) hypertension: Secondary | ICD-10-CM | POA: Diagnosis not present

## 2024-01-08 ENCOUNTER — Encounter: Payer: Self-pay | Admitting: Infectious Diseases
